# Patient Record
Sex: Male | Born: 1990 | Race: White | Hispanic: No | Marital: Married | Smoking: Never smoker
Health system: Southern US, Community
[De-identification: ages and names within clinical notes are randomized; demographics above are authoritative.]

## PROBLEM LIST (undated history)

## (undated) DIAGNOSIS — Q676 Pectus excavatum: Secondary | ICD-10-CM

## (undated) DIAGNOSIS — R0609 Other forms of dyspnea: Secondary | ICD-10-CM

## (undated) DIAGNOSIS — R9431 Abnormal electrocardiogram [ECG] [EKG]: Secondary | ICD-10-CM

## (undated) DIAGNOSIS — G4452 New daily persistent headache (NDPH): Secondary | ICD-10-CM

## (undated) DIAGNOSIS — Z6825 Body mass index (BMI) 25.0-25.9, adult: Secondary | ICD-10-CM

## (undated) HISTORY — DX: Abnormal electrocardiogram (ECG) (EKG): R94.31

## (undated) HISTORY — DX: Other forms of dyspnea: R06.09

## (undated) HISTORY — DX: New daily persistent headache (ndph): G44.52

## (undated) HISTORY — DX: Body mass index (BMI) 25.0-25.9, adult: Z68.25

## (undated) HISTORY — DX: Pectus excavatum: Q67.6

---
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
## 2008-12-13 ENCOUNTER — Emergency Department (HOSPITAL_COMMUNITY): Admission: EM | Admit: 2008-12-13 | Discharge: 2008-12-13 | Payer: Self-pay | Admitting: Emergency Medicine

## 2010-05-12 IMAGING — CR DG FINGER RING 2+V*R*
3 series · 3 of 3 positions shown · non-contrast
Comparison: None

CLINICAL DATA: Laceration

RIGHT RING FINGER 2+V

[x finger pa right]
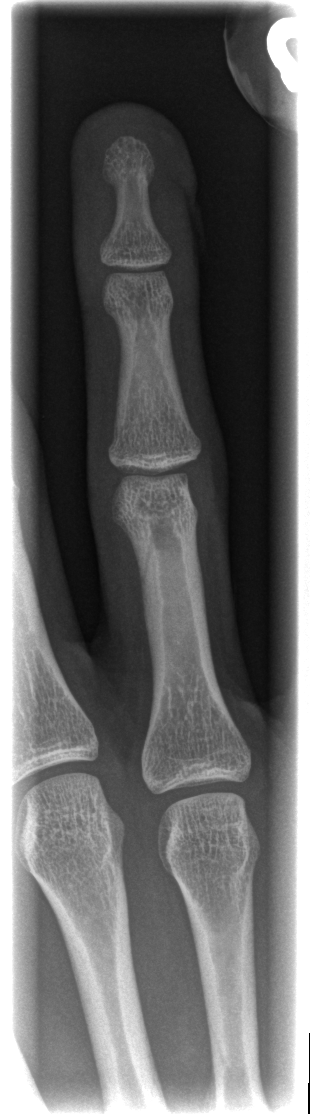

[x finger obl. right]
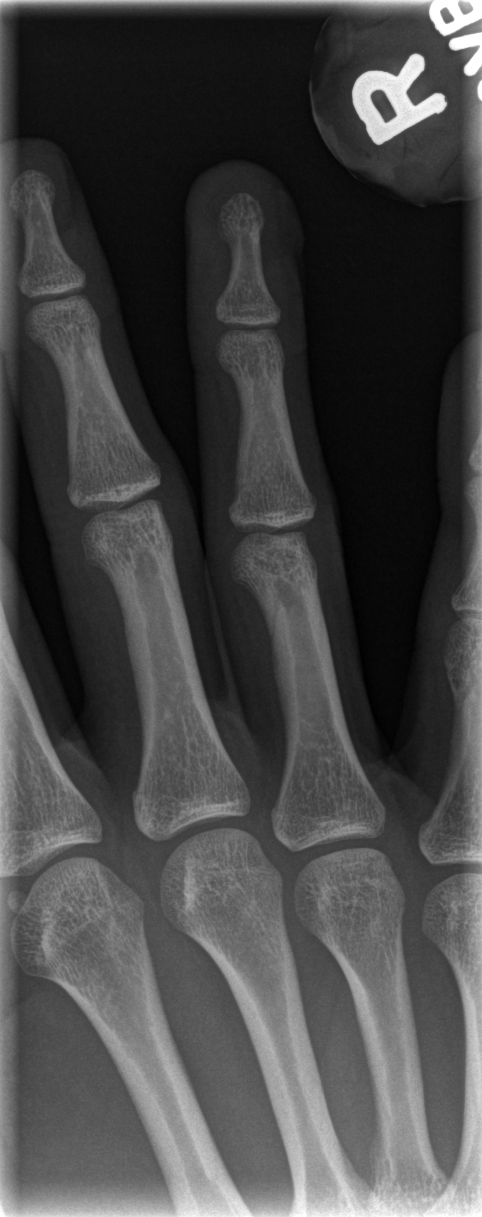

[x finger lateral right]
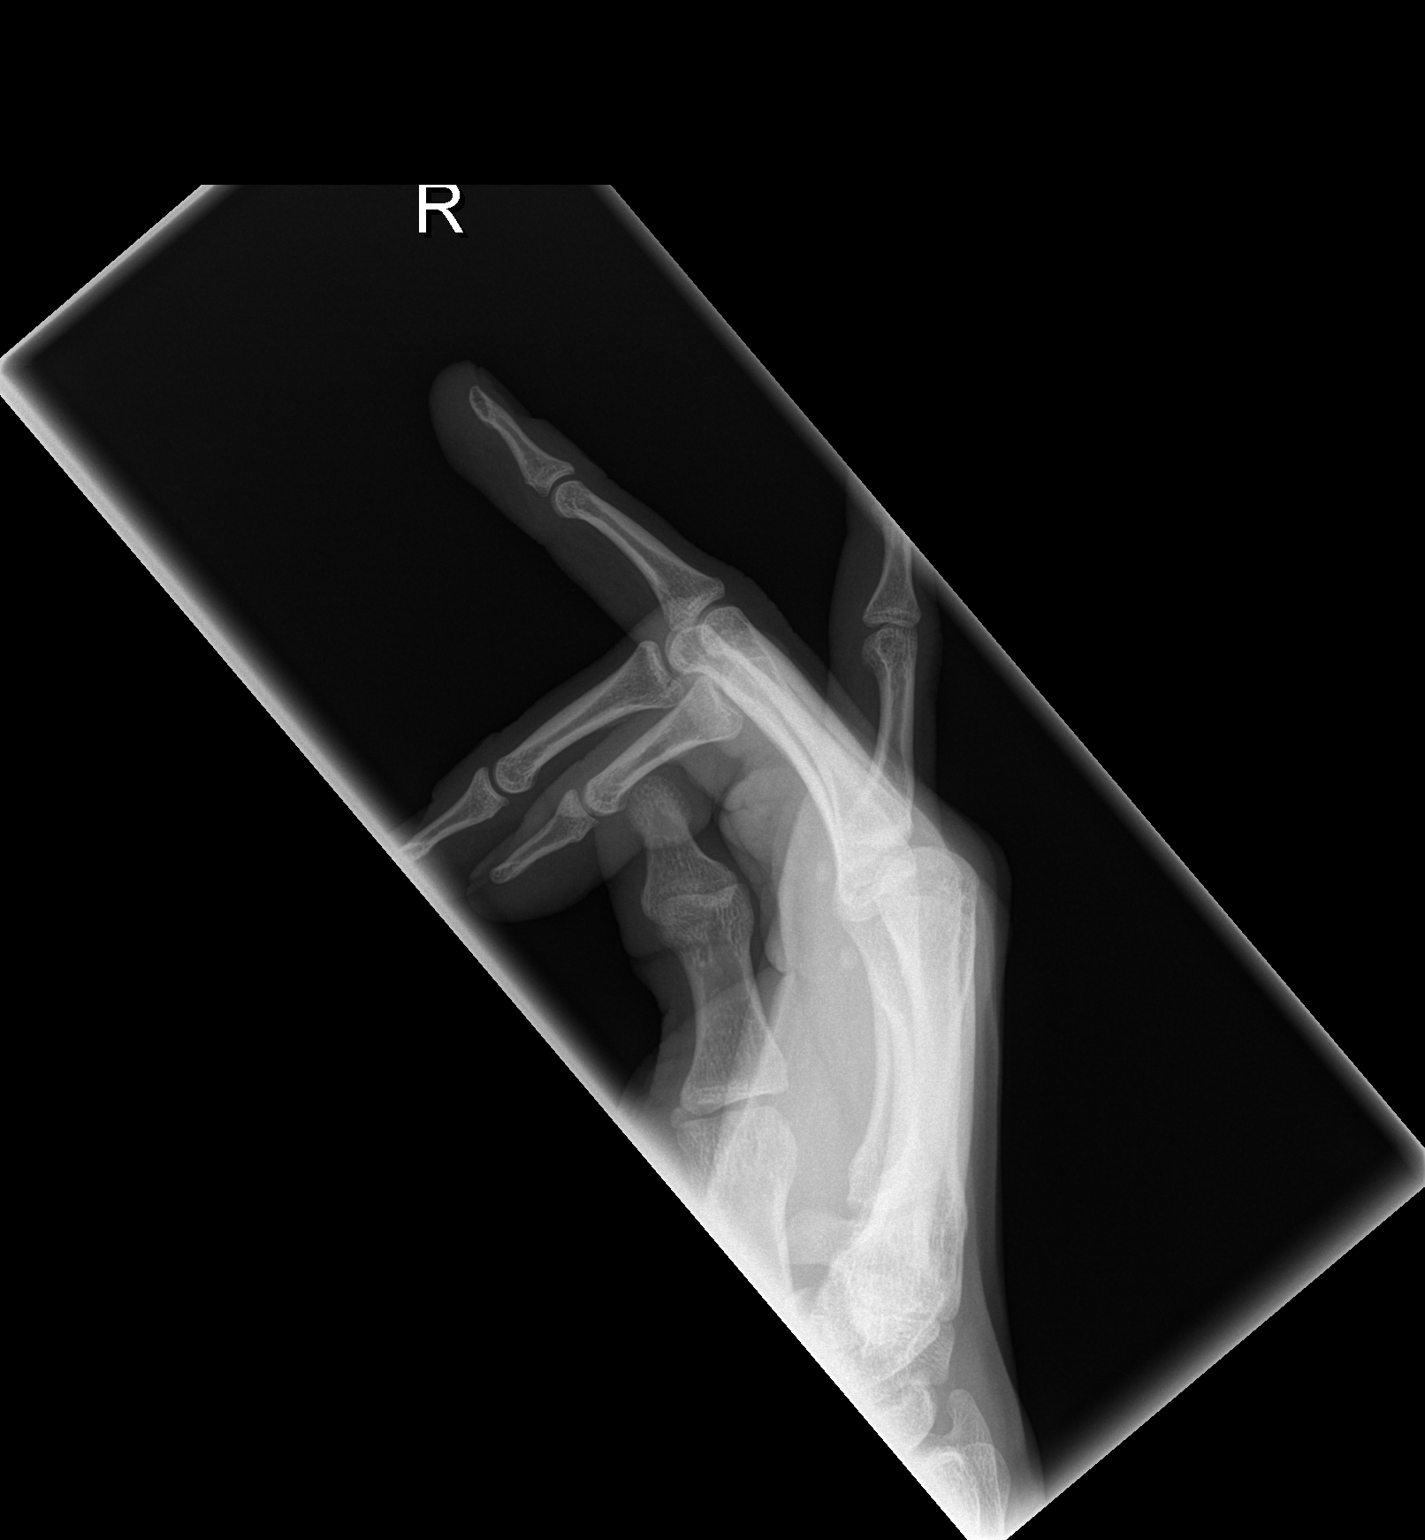

[3 of 3 positions shown; findings below may reference images not displayed]

FINDINGS: Soft tissue injury is noted.  No underlying fracture or
dislocation.
IMPRESSION: No acute bony pathology.

## 2012-05-13 ENCOUNTER — Other Ambulatory Visit (HOSPITAL_COMMUNITY): Payer: Self-pay | Admitting: Cardiology

## 2012-05-13 DIAGNOSIS — I309 Acute pericarditis, unspecified: Secondary | ICD-10-CM

## 2012-05-14 ENCOUNTER — Ambulatory Visit (HOSPITAL_COMMUNITY): Payer: Self-pay

## 2014-06-24 DIAGNOSIS — R599 Enlarged lymph nodes, unspecified: Secondary | ICD-10-CM | POA: Insufficient documentation

## 2014-06-24 HISTORY — DX: Enlarged lymph nodes, unspecified: R59.9

## 2014-06-26 DIAGNOSIS — B2790 Infectious mononucleosis, unspecified without complication: Secondary | ICD-10-CM | POA: Insufficient documentation

## 2014-06-26 DIAGNOSIS — J039 Acute tonsillitis, unspecified: Secondary | ICD-10-CM

## 2014-06-26 DIAGNOSIS — B009 Herpesviral infection, unspecified: Secondary | ICD-10-CM

## 2014-06-26 DIAGNOSIS — J029 Acute pharyngitis, unspecified: Secondary | ICD-10-CM

## 2014-06-26 HISTORY — DX: Herpesviral infection, unspecified: B00.9

## 2014-06-26 HISTORY — DX: Infectious mononucleosis, unspecified without complication: B27.90

## 2014-06-26 HISTORY — DX: Acute tonsillitis, unspecified: J03.90

## 2014-06-26 HISTORY — DX: Acute pharyngitis, unspecified: J02.9

## 2014-10-28 ENCOUNTER — Encounter: Payer: Self-pay | Admitting: *Deleted

## 2014-11-27 ENCOUNTER — Encounter: Payer: Self-pay | Admitting: Cardiology

## 2018-12-23 ENCOUNTER — Ambulatory Visit: Payer: Managed Care, Other (non HMO) | Admitting: Neurology

## 2019-01-27 ENCOUNTER — Ambulatory Visit: Payer: Managed Care, Other (non HMO) | Admitting: Neurology

## 2021-10-06 DIAGNOSIS — R0609 Other forms of dyspnea: Secondary | ICD-10-CM | POA: Insufficient documentation

## 2021-10-06 DIAGNOSIS — Q676 Pectus excavatum: Secondary | ICD-10-CM | POA: Insufficient documentation

## 2021-10-06 DIAGNOSIS — R9431 Abnormal electrocardiogram [ECG] [EKG]: Secondary | ICD-10-CM | POA: Insufficient documentation

## 2021-10-06 DIAGNOSIS — Z6825 Body mass index (BMI) 25.0-25.9, adult: Secondary | ICD-10-CM | POA: Insufficient documentation

## 2021-10-06 DIAGNOSIS — G4452 New daily persistent headache (NDPH): Secondary | ICD-10-CM | POA: Insufficient documentation

## 2021-10-07 ENCOUNTER — Encounter: Payer: Self-pay | Admitting: Cardiology

## 2021-10-07 ENCOUNTER — Ambulatory Visit (INDEPENDENT_AMBULATORY_CARE_PROVIDER_SITE_OTHER): Payer: No Typology Code available for payment source | Admitting: Cardiology

## 2021-10-07 VITALS — BP 113/66 | HR 87 | Ht 75.0 in | Wt 193.8 lb

## 2021-10-07 DIAGNOSIS — R9431 Abnormal electrocardiogram [ECG] [EKG]: Secondary | ICD-10-CM

## 2021-10-07 DIAGNOSIS — R0609 Other forms of dyspnea: Secondary | ICD-10-CM

## 2021-10-07 NOTE — Progress Notes (Signed)
Cardiology Office Note:    Date:  10/07/2021   ID:  Hunter Mcconnell, DOB 02/11/1991, MRN [id number]  PCP:  Lise Auer, MD  Cardiologist:  Garwin Brothers, MD   Referring MD: Lise Auer, MD    ASSESSMENT:    1. Dyspnea on minimal exertion   2. Abnormal ECG    PLAN:    In order of problems listed above:  Primary prevention stressed with the patient.  Importance of compliance with diet medication stressed and she vocalized understanding. Dyspnea on exertion: This is concerning.  I will send him to [facility] for blood work including D-dimer.  He tells me that he has not had blood work for the past several months.  This will help me plan for my further evaluation.  If the D-dimer is negative then I will assess him with the stress echocardiogram to see for any objective evidence of coronary or cardiac etiology for shortness of breath. Cardiac murmur and abnormal EKG: Echocardiogram will be done to assess murmur heard on auscultation and abnormal EKG. Patient will be seen in follow-up appointment in 2 months or earlier if the patient has any concerns.  He knows to go to the nearest emergency room for any concerning symptoms.    Medication Adjustments/Labs and Tests Ordered: Current medicines are reviewed at length with the patient today.  Concerns regarding medicines are outlined above.  No orders of the defined types were placed in this encounter.  No orders of the defined types were placed in this encounter.    No chief complaint on file.    History of Present Illness:    Hunter Mcconnell is a 31 y.o. male.  He has no significant past past medical history.  He mentions to me that he is noticing shortness of breath on exertion which is concerning to him.  He does not give any history of any medical issues such as asthma or or cigarette smoking.  He has not done any drugs in the past.  He overall is a healthy gentleman.  He shortness of breath on exertion is perplexing  and therefore he was sent here for evaluation.  His EKG is abnormal.  Past Medical History:  Diagnosis Date   Abnormal ECG    incomplete RBBB,borderline LA enlargement   Acute tonsillitis 06/26/2014   BMI 25.0-25.9,adult    Dyspnea on minimal exertion    Headache, new daily persistent ([facility])    Herpes simplex infection 06/26/2014   Formatting of this note might be different from the original. Detected via PCR during acute pharyngitis/tonsillitis   Mononucleosis 06/26/2014   Pectus excavatum    Pectus excavatum    Sore throat 06/26/2014   Swollen lymph nodes 06/24/2014    Past Surgical History:  Procedure Laterality Date   ELECTROPHYSIOLOGIC STUDY  2008    Current Medications: Current Meds  Medication Sig   amitriptyline (ELAVIL) 25 MG tablet Take 25 mg by mouth at bedtime.     Allergies:   Patient has no known allergies.   Social History   Socioeconomic History   Marital status: Married    Spouse name: Not on file   Number of children: Not on file   Years of education: Not on file   Highest education level: Not on file  Occupational History   Not on file  Tobacco Use   Smoking status: Never   Smokeless tobacco: Not on file  Substance and Sexual Activity   Alcohol use: No  Alcohol/week: 0.0 standard drinks of alcohol   Drug use: No   Sexual activity: Not on file  Other Topics Concern   Not on file  Social History Narrative   Not on file   Social Determinants of Health   Financial Resource Strain: Not on file  Food Insecurity: Not on file  Transportation Needs: Not on file  Physical Activity: Not on file  Stress: Not on file  Social Connections: Not on file     Family History: The patient's family history includes Diabetes in his maternal grandfather and paternal grandfather; Heart attack in his maternal grandfather and maternal grandmother; Heart disease in his father, maternal grandfather, maternal grandmother, paternal grandfather, and paternal  grandmother; Lung cancer in his maternal grandfather and paternal grandfather.  ROS:   Please see the history of present illness.    All other systems reviewed and are negative.  EKGs/Labs/Other Studies Reviewed:    The following studies were reviewed today: EKG reveals sinus rhythm biatrial enlargement right superior axis deviation right ventricular hypertrophy and poor anterior forces and nonspecific ST-T changes cannot rule out anterior wall myocardial infarction.   Recent Labs: No results found for requested labs within last 365 days.  Recent Lipid Panel No results found for: "CHOL", "TRIG", "HDL", "CHOLHDL", "VLDL", "LDLCALC", "LDLDIRECT"  Physical Exam:    VS:  BP 113/66   Pulse 87   Ht 6\' 3"  (1.905 m)   Wt 193 lb 12.8 oz (87.9 kg)   SpO2 97%   BMI 24.22 kg/m     Wt Readings from Last 3 Encounters:  10/07/21 193 lb 12.8 oz (87.9 kg)     GEN: Patient is in no acute distress HEENT: Normal NECK: No JVD; No carotid bruits LYMPHATICS: No lymphadenopathy CARDIAC: Hear sounds regular, 2/6 systolic murmur at the apex. RESPIRATORY:  Clear to auscultation without rales, wheezing or rhonchi  ABDOMEN: Soft, non-tender, non-distended MUSCULOSKELETAL:  No edema; No deformity  SKIN: Warm and dry NEUROLOGIC:  Alert and oriented x 3 PSYCHIATRIC:  Normal affect   Signed, 12/08/21, MD  10/07/2021 3:05 PM     [facility]

## 2021-10-07 NOTE — Patient Instructions (Signed)
Medication Instructions:  Your physician recommends that you continue on your current medications as directed. Please refer to the Current Medication list given to you today.  *If you need a refill on your cardiac medications before your next appointment, please call your pharmacy*   Lab Work: Go to [facility] now for stat labs. If you have labs (blood work) drawn today and your tests are completely normal, you will receive your results only by: MyChart Message (if you have MyChart) OR A paper copy in the mail If you have any lab test that is abnormal or we need to change your treatment, we will call you to review the results.   Testing/Procedures: Stress Echocardiogram Information Sheet Instructions:  This test will be done at [facility].  Nothing to eat or drink after midnight.  Dress prepared to exercise.  Please bring all current prescription medications.  Your physician has requested that you have an echocardiogram. Echocardiography is a painless test that uses sound waves to create images of your heart. It provides your doctor with information about the size and shape of your heart and how well your heart's chambers and valves are working. This procedure takes approximately one hour. There are no restrictions for this procedure.    Follow-Up: At [facility], you and your health needs are our priority.  As part of our continuing mission to provide you with exceptional heart care, we have created designated Provider Care Teams.  These Care Teams include your primary Cardiologist (physician) and Advanced Practice Providers (APPs -  Physician Assistants and Nurse Practitioners) who all work together to provide you with the care you need, when you need it.  We recommend signing up for the patient portal called "MyChart".  Sign up information is provided on this After Visit Summary.  MyChart is used to connect with patients for Virtual Visits (Telemedicine).  Patients  are able to view lab/test results, encounter notes, upcoming appointments, etc.  Non-urgent messages can be sent to your provider as well.   To learn more about what you can do with MyChart, go to [email or web address].    Your next appointment:   2 month(s)  The format for your next appointment:   In Person  Provider:   Belva Crome, MD   Other Instructions Exercise Stress Echocardiogram An exercise stress echocardiogram is a test to check how well your heart is working. This test uses sound waves (ultrasound) and a computer to make images of your heart before and after exercise. Ultrasound images that are taken before you exercise (resting echocardiogram) will show how much blood is getting to your heart muscle and how well yourheart muscle and heart valves are functioning. During the next part of this test, you will walk on a treadmill or ride a stationary bicycle to see how exercise affects your heart. While you exercise, the electrical activity of your heart will be monitored with anelectrocardiogram (ECG). Your blood pressure will also be monitored. You may have this test if you have: Chest pain or other symptoms of a heart problem. Recently had a heart attack or heart surgery. Heart valve problems. A condition that causes narrowing of the blood vessels that supply your heart (coronary artery disease). A high risk of heart disease and are starting a new exercise program. A high risk of heart disease and need to have major surgery. Heart arrhythmia (abnormal heart rhythm) problems. Heart failure problems. Tell a health care provider about: Any allergies you have. All medicines you are taking,  including vitamins, herbs, eye drops, creams, and over-the-counter medicines. Any problems you or family members have had with anesthetic medicines. Any surgeries you have had. Any blood disorders you have. Any medical conditions you have. Whether you are pregnant or may be  pregnant. What are the risks? Generally, this is a safe test. However, problems may occur, including: Chest pain. Dizziness or light-headedness. Shortness of breath. Increased or irregular heartbeat (palpitations). Nausea or vomiting. Heart attack. This is very rare. What happens before the test? Medicines Ask your health care provider about changing or stopping your regular medicines. This is especially important if you are taking diabetes medicines or blood thinners. If you use an inhaler, bring it with you to the test. General instructions Wear loose, comfortable clothing and walking shoes. Follow instructions from your health care provider about eating or drinking restrictions. You may be asked to avoid all forms of caffeine for 24 hours before your test, or as told by your health care provider. Do not use any products that contain nicotine or tobacco for 4 hours before the test, or as told by your health care provider. These products include cigarettes, chewing tobacco, and vaping devices, such as e-cigarettes. If you need help quitting, ask your health care provider. What happens during the test?  You will take off your clothes from the waist up and put on a hospital gown. Electrodes or electrocardiogram (ECG) patches may be placed on your chest. The electrodes or patches are then connected to a device that monitors your heart rate and rhythm. A blood pressure cuff will be placed on your arm. You will lie down on a table for an ultrasound exam before you exercise. A gel will be applied to your chest to help sound waves pass through your skin. A handheld device, called a transducer, will be pressed against your chest and moved over your heart. The transducer produces sound waves that travel to your heart and bounce back (or "echo" back) to the transducer. These sound waves will be captured in real-time and changed into images of your heart that can be viewed on a video monitor. The images  will be recorded on a computer and reviewed by your health care provider. Once the ultrasound is complete, you will start exercising by walking on a treadmill or pedaling a stationary bicycle. Your blood pressure and heart rhythm will be monitored while you exercise. The exercise will gradually get harder or faster. You will exercise until: Your heart reaches a target level. You are too tired to continue. You cannot continue because of chest pain, weakness, or dizziness. You will have another ultrasound exam immediately after you stop exercising. The procedure may vary among health care providers and hospitals. What can I expect after the test? After your test, it is common to have: Mild soreness. Mild fatigue. Your heart rate and blood pressure will be monitored until they return to yournormal levels. You should not have any new symptoms after this test. Follow these instructions at home: After your stress test, you should be able to return to your normal activities and diet. Take over-the-counter and prescription medicines only as told by your health care provider. Keep all follow-up visits. This is important. It is up to you to get the results of your test. Ask your health care provider, or the department that is doing the test, when your results will be ready. Contact a health care provider if you: Feel dizzy or light-headed. Have a fast or irregular heartbeat. Have  nausea or vomiting. Have a headache. Feel short of breath. Get help right away if you: Develop pain or pressure: In your chest. In your jaw or neck. Between your shoulder blades. Radiating down your left arm. Faint. Have trouble breathing. These symptoms may represent a serious problem that is an emergency. Do not wait to see if the symptoms will go away. Get medical help right away. Call your local emergency services (911 in the [place] Do not drive yourself to the hospital. Summary An exercise stress  echocardiogram is a test that uses ultrasound to check how well your heart works before and after exercise. Before the test, follow instructions from your health care provider about stopping medicines and avoiding caffeine, nicotine and tobacco, and certain foods and drinks. During the test, your blood pressure and heart rhythm will be monitored while you exercise on a treadmill or stationary bicycle. This information is not intended to replace advice given to you by your health care provider. Make sure you discuss any questions you have with your healthcare provider. Document Revised: 11/11/2019 Document Reviewed: 11/11/2019 Elsevier Patient Education  2022 [employer].

## 2021-10-12 ENCOUNTER — Encounter: Payer: Self-pay | Admitting: Cardiology

## 2021-10-12 DIAGNOSIS — R0609 Other forms of dyspnea: Secondary | ICD-10-CM

## 2021-12-13 ENCOUNTER — Ambulatory Visit: Payer: No Typology Code available for payment source | Admitting: Cardiology

## 2021-12-15 ENCOUNTER — Encounter: Payer: Self-pay | Admitting: Cardiology

## 2021-12-15 ENCOUNTER — Ambulatory Visit: Payer: No Typology Code available for payment source | Attending: Cardiology | Admitting: Cardiology

## 2021-12-15 VITALS — BP 130/94 | HR 56 | Ht 75.0 in | Wt 197.6 lb

## 2021-12-15 DIAGNOSIS — R9431 Abnormal electrocardiogram [ECG] [EKG]: Secondary | ICD-10-CM

## 2021-12-15 DIAGNOSIS — R030 Elevated blood-pressure reading, without diagnosis of hypertension: Secondary | ICD-10-CM | POA: Insufficient documentation

## 2021-12-15 DIAGNOSIS — E782 Mixed hyperlipidemia: Secondary | ICD-10-CM | POA: Diagnosis not present

## 2021-12-15 NOTE — Progress Notes (Signed)
Cardiology Office Note:    Date:  12/15/2021   ID:  Hunter Mcconnell, DOB 1991-01-21, MRN [id number]  PCP:  Lise Auer, MD  Cardiologist:  Garwin Brothers, MD   Referring MD: Lise Auer, MD    ASSESSMENT:    1. Abnormal ECG   2. Elevated blood pressure reading in office without diagnosis of hypertension   3. Mixed dyslipidemia    PLAN:    In order of problems listed above:  Primary prevention stressed with the patient.  Importance of compliance with diet medication stressed and he vocalized understanding.  He was advised to walk at least half an hour a day 5 days a week and he promises to do so. Elevated blood pressure without diagnosis of hypertension: Patient mentions to me that his blood pressure is better at home.  He has an element of whitecoat hypertension.  He will keep a track of it and get back to it with his primary care doctor for follow-up. Mixed dyslipidemia: Mildly elevated lipids.  This is followed by primary care.  Diet emphasized.  He promises to do better. He will be seen in follow-up appointment on a as needed basis.  Patient had multiple questions which were answered to his satisfaction.   Medication Adjustments/Labs and Tests Ordered: Current medicines are reviewed at length with the patient today.  Concerns regarding medicines are outlined above.  No orders of the defined types were placed in this encounter.  No orders of the defined types were placed in this encounter.    No chief complaint on file.    History of Present Illness:    Hunter Mcconnell is a 31 y.o. male.  Patient has past medical history of abnormal EKG.  He underwent echocardiogram and stress test which were unremarkable.  He denies any chest pain orthopnea or PND.  He denies any history of hypertension.  At the time of my evaluation, the patient is alert awake oriented and in no distress.  Past Medical History:  Diagnosis Date   Abnormal ECG    incomplete RBBB,borderline LA  enlargement   Acute tonsillitis 06/26/2014   BMI 25.0-25.9,adult    Dyspnea on minimal exertion    Headache, new daily persistent ([facility])    Herpes simplex infection 06/26/2014   Formatting of this note might be different from the original. Detected via PCR during acute pharyngitis/tonsillitis   Mononucleosis 06/26/2014   Pectus excavatum    Pectus excavatum    Sore throat 06/26/2014   Swollen lymph nodes 06/24/2014    Past Surgical History:  Procedure Laterality Date   ELECTROPHYSIOLOGIC STUDY  2008    Current Medications: No outpatient medications have been marked as taking for the 12/15/21 encounter (Office Visit) with Tad Fancher, Aundra Dubin, MD.     Allergies:   Patient has no known allergies.   Social History   Socioeconomic History   Marital status: Married    Spouse name: Not on file   Number of children: Not on file   Years of education: Not on file   Highest education level: Not on file  Occupational History   Not on file  Tobacco Use   Smoking status: Never   Smokeless tobacco: Not on file  Substance and Sexual Activity   Alcohol use: No    Alcohol/week: 0.0 standard drinks of alcohol   Drug use: No   Sexual activity: Not on file  Other Topics Concern   Not on file  Social History Narrative  Not on file   Social Determinants of Health   Financial Resource Strain: Not on file  Food Insecurity: Not on file  Transportation Needs: Not on file  Physical Activity: Not on file  Stress: Not on file  Social Connections: Not on file     Family History: The patient's family history includes Diabetes in his maternal grandfather and paternal grandfather; Heart attack in his maternal grandfather and maternal grandmother; Heart disease in his father, maternal grandfather, maternal grandmother, paternal grandfather, and paternal grandmother; Lung cancer in his maternal grandfather and paternal grandfather.  ROS:   Please see the history of present illness.    All other  systems reviewed and are negative.  EKGs/Labs/Other Studies Reviewed:    The following studies were reviewed today: I discussed my findings with the patient at length.  Echo and stress echo were unremarkable.   Recent Labs: No results found for requested labs within last 365 days.  Recent Lipid Panel No results found for: "CHOL", "TRIG", "HDL", "CHOLHDL", "VLDL", "LDLCALC", "LDLDIRECT"  Physical Exam:    VS:  BP (!) 130/94   Pulse (!) 56   Ht 6\' 3"  (1.905 m)   Wt 197 lb 9.6 oz (89.6 kg)   SpO2 97%   BMI 24.70 kg/m     Wt Readings from Last 3 Encounters:  12/15/21 197 lb 9.6 oz (89.6 kg)  10/07/21 193 lb 12.8 oz (87.9 kg)     GEN: Patient is in no acute distress HEENT: Normal NECK: No JVD; No carotid bruits LYMPHATICS: No lymphadenopathy CARDIAC: Hear sounds regular, 2/6 systolic murmur at the apex. RESPIRATORY:  Clear to auscultation without rales, wheezing or rhonchi  ABDOMEN: Soft, non-tender, non-distended MUSCULOSKELETAL:  No edema; No deformity  SKIN: Warm and dry NEUROLOGIC:  Alert and oriented x 3 PSYCHIATRIC:  Normal affect   Signed, 12/08/21, MD  12/15/2021 3:52 PM    [facility]

## 2021-12-15 NOTE — Patient Instructions (Signed)

## 2023-08-17 ENCOUNTER — Emergency Department (HOSPITAL_BASED_OUTPATIENT_CLINIC_OR_DEPARTMENT_OTHER): Admission: EM | Admit: 2023-08-17 | Discharge: 2023-08-17 | Disposition: A | Discharge status: Home / Self Care

## 2023-08-17 ENCOUNTER — Encounter (HOSPITAL_BASED_OUTPATIENT_CLINIC_OR_DEPARTMENT_OTHER): Payer: Self-pay | Admitting: Emergency Medicine

## 2023-08-17 ENCOUNTER — Emergency Department (HOSPITAL_BASED_OUTPATIENT_CLINIC_OR_DEPARTMENT_OTHER): Admitting: Radiology

## 2023-08-17 ENCOUNTER — Other Ambulatory Visit: Payer: Self-pay

## 2023-08-17 ENCOUNTER — Emergency Department (HOSPITAL_BASED_OUTPATIENT_CLINIC_OR_DEPARTMENT_OTHER)

## 2023-08-17 DIAGNOSIS — R944 Abnormal results of kidney function studies: Secondary | ICD-10-CM | POA: Insufficient documentation

## 2023-08-17 DIAGNOSIS — R519 Headache, unspecified: Secondary | ICD-10-CM | POA: Diagnosis present

## 2023-08-17 DIAGNOSIS — R030 Elevated blood-pressure reading, without diagnosis of hypertension: Secondary | ICD-10-CM | POA: Diagnosis not present

## 2023-08-17 DIAGNOSIS — I10 Essential (primary) hypertension: Secondary | ICD-10-CM

## 2023-08-17 LAB — CBC
HCT: 42.9 % (ref 39.0–52.0)
Hemoglobin: 14.6 g/dL (ref 13.0–17.0)
MCH: 30.6 pg (ref 26.0–34.0)
MCHC: 34 g/dL (ref 30.0–36.0)
MCV: 89.9 fL (ref 80.0–100.0)
Platelets: 215 10*3/uL (ref 150–400)
RBC: 4.77 MIL/uL (ref 4.22–5.81)
RDW: 11.8 % (ref 11.5–15.5)
WBC: 6.5 10*3/uL (ref 4.0–10.5)
nRBC: 0 % (ref 0.0–0.2)

## 2023-08-17 LAB — BASIC METABOLIC PANEL WITH GFR
Anion gap: 11 (ref 5–15)
BUN: 19 mg/dL (ref 6–20)
CO2: 26 mmol/L (ref 22–32)
Calcium: 9.4 mg/dL (ref 8.9–10.3)
Chloride: 105 mmol/L (ref 98–111)
Creatinine, Ser: 1.28 mg/dL — ABNORMAL HIGH (ref 0.61–1.24)
GFR, Estimated: >60 mL/min (ref >60)
Glucose, Bld: 91 mg/dL (ref 70–99)
Potassium: 4 mmol/L (ref 3.5–5.1)
Sodium: 142 mmol/L (ref 135–145)

## 2023-08-17 LAB — TROPONIN T, HIGH SENSITIVITY: Troponin T High Sensitivity: <15 ng/L (ref <19)

## 2023-08-17 MED ORDER — SODIUM CHLORIDE 0.9 % IV BOLUS
1000.0000 mL | Freq: Once | INTRAVENOUS | Status: AC
  Administered 2023-08-17: 1000 mL via INTRAVENOUS

## 2023-08-17 MED ORDER — AMLODIPINE BESYLATE 5 MG PO TABS: 5.0000 mg | ORAL_TABLET | Freq: Every day | ORAL | 1 refills | Status: AC

## 2023-08-17 MED ORDER — DIPHENHYDRAMINE HCL 50 MG/ML IJ SOLN
25.0000 mg | Freq: Once | INTRAMUSCULAR | Status: AC
  Administered 2023-08-17: 25 mg via INTRAVENOUS
  Filled 2023-08-17: qty 1

## 2023-08-17 MED ORDER — METOCLOPRAMIDE HCL 5 MG/ML IJ SOLN
10.0000 mg | Freq: Once | INTRAMUSCULAR | Status: AC
  Administered 2023-08-17: 10 mg via INTRAVENOUS
  Filled 2023-08-17: qty 2

## 2023-08-17 MED ORDER — ACETAMINOPHEN 500 MG PO TABS
1000.0000 mg | ORAL_TABLET | Freq: Once | ORAL | Status: AC
  Administered 2023-08-17: 1000 mg via ORAL
  Filled 2023-08-17: qty 2

## 2023-08-17 MED ORDER — SUMATRIPTAN SUCCINATE 100 MG PO TABS: 100.0000 mg | ORAL_TABLET | ORAL | 0 refills | Status: DC | PRN

## 2023-08-17 NOTE — ED Triage Notes (Signed)
 Pt endorses "bad HAs" x 3-3 days recently, checked bp and was 160/100. Also reports LT side CP for same time.

## 2023-08-17 NOTE — ED Provider Notes (Signed)
 [facility] AT [facility] Provider Note   CSN: [id number] Arrival date & time: 08/17/23  1705     History  Chief Complaint  Patient presents with   Hypertension    Hunter Mcconnell is a 33 y.o. male.   Hypertension   33 year old male presents emergency department with complaints of elevated blood pressure reading, headache, chest pain.  Patient states that he has been dealing with intermittent headaches for the past several years.  Reports current headache feels somewhat similar.  States that his headaches have been difficult to control and does have outstanding referral to neurology.  Tried Excedrin yesterday without improvement of headache.  States he was having a little worsening of headache today at work.  A coworker took his blood pressure was elevated in the 150s and subsequently in the 160s systolic.  States that since earlier this morning, has been having left-sided chest pain.  Stated this is also been intermittent since onset.  Denies any exertional worsening of symptoms or her symptoms worsen with taking a deep breath.  Denies any accompanied feelings of shortness of breath, abdominal pain, vomiting.  Denies any history of known elevated blood pressure.  Past medical history significant for headache, mononucleosis, HSV, dyslipidemia  Home Medications Prior to Admission medications   Not on File      Allergies    Patient has no known allergies.    Review of Systems   Review of Systems  All other systems reviewed and are negative.   Physical Exam Updated Vital Signs BP (!) 154/103 (BP Location: Right Arm)   Pulse 66   Temp 98.6 F (37 C) (Oral)   Resp 18   Wt 93.9 kg   SpO2 98%   BMI 25.87 kg/m  Physical Exam Vitals and nursing note reviewed.  Constitutional:      General: He is not in acute distress.    Appearance: He is well-developed.  HENT:     Head: Normocephalic and atraumatic.  Eyes:     Conjunctiva/sclera:  Conjunctivae normal.  Cardiovascular:     Rate and Rhythm: Normal rate and regular rhythm.     Heart sounds: No murmur heard. Pulmonary:     Effort: Pulmonary effort is normal. No respiratory distress.     Breath sounds: Normal breath sounds. No wheezing, rhonchi or rales.  Abdominal:     Palpations: Abdomen is soft.     Tenderness: There is no abdominal tenderness.  Musculoskeletal:        General: No swelling.     Cervical back: Neck supple.  Skin:    General: Skin is warm and dry.     Capillary Refill: Capillary refill takes less than 2 seconds.  Neurological:     Mental Status: He is alert.     Comments: Alert and oriented to self, place, time and event.   Speech is fluent, clear without dysarthria or dysphasia.   Strength 5/5 in upper/lower extremities   Sensation intact in upper/lower extremities   Normal gait.  CN I not tested  CN II not tested CN III, IV, VI PERRLA and EOMs intact bilaterally  CN V Intact sensation to sharp and light touch to the face  CN VII facial movements symmetric  CN VIII not tested  CN IX, X no uvula deviation, symmetric rise of soft palate  CN XI 5/5 SCM and trapezius strength bilaterally  CN XII Midline tongue protrusion, symmetric L/R movements     Psychiatric:  Mood and Affect: Mood normal.     ED Results / Procedures / Treatments   Labs (all labs ordered are listed, but only abnormal results are displayed) Labs Reviewed  CBC  BASIC METABOLIC PANEL WITH GFR  TROPONIN T, HIGH SENSITIVITY    EKG None  Radiology No results found.  Procedures Procedures    Medications Ordered in ED Medications - No data to display  ED Course/ Medical Decision Making/ A&P                                 Medical Decision Making Amount and/or Complexity of Data Reviewed Labs: ordered. Radiology: ordered.   This patient presents to the ED for concern of headache, chest pain, this involves an extensive number of treatment  options, and is a complaint that carries with it a high risk of complications and morbidity.  The differential diagnosis includes CVA, migraine/tension/cluster headache, cerebral venous thrombosis, carotid artery/vertebral artery dissection, ACS, PE, GERD, musculoskeletal, pneumothorax, pneumonia, other   Co morbidities that complicate the patient evaluation  See HPI   Additional history obtained:  Additional history obtained from EMR External records from outside source obtained and reviewed including hospital records   Lab Tests:  I Ordered, and personally interpreted labs.  The pertinent results include: No leukocytosis.  No evidence of anemia.  Platelets within range. Extremities.  Elevation in creatinine 1.28.  No prior to compare.  Troponin of less than 15   Imaging Studies ordered:  I ordered imaging studies including chest x-ray, CT head I independently visualized and interpreted imaging which showed  Chest x-ray: No acute cardiopulmonary abnormality CT head: No acute intracranial abnormality. I agree with the radiologist interpretation   Cardiac Monitoring: / EKG:  The patient was maintained on a cardiac monitor.  I personally viewed and interpreted the cardiac monitored which showed an underlying rhythm of: Sinus rhythm.  Possible left atrial enlargement.   Consultations Obtained:  N/a   Problem List / ED Course / Critical interventions / Medication management  Elevated blood pressure reading, headache, chest pain I ordered medication including Tylenol, Benadryl, Reglan, normal saline   Reevaluation of the patient after these medicines showed that the patient improved I have reviewed the patients home medicines and have made adjustments as needed   Social Determinants of Health:  Denies tobacco, licit drug use.   Test / Admission - Considered:  Elevated blood pressure reading, headache, chest pain Vitals signs significant for hypertension blood  pressure 154/100.Aaron Aas Otherwise within normal range and stable throughout visit. Laboratory/imaging studies significant for: See above 33 year old male presents emergency department with complaints of elevated blood pressure reading, headache, chest pain.  Patient states that he has been dealing with intermittent headaches for the past several years.  Reports current headache feels somewhat similar.  States that his headaches have been difficult to control and does have outstanding referral to neurology.  Tried Excedrin yesterday without improvement of headache.  States he was having a little worsening of headache today at work.  A coworker took his blood pressure was elevated in the 150s and subsequently in the 160s systolic.  States that since earlier this morning, has been having left-sided chest pain.  Stated this is also been intermittent since onset.  Denies any exertional worsening of symptoms or her symptoms worsen with taking a deep breath.  Denies any accompanied feelings of shortness of breath, abdominal pain, vomiting.  Denies any history  of known elevated blood pressure. On exam, no chest wall tenderness.  No abdominal tenderness.  Lungs clear to auscultation bilaterally.  Workup today reassuring.  Patient with negative troponin, lack of acute ischemic change on EKG; low suspicion for ACS.  Patient Wells PE 0 and PERC negative; low suspicion for PE.  Patient without chest pain rating to back, pulse deficits, neurodeficits, significant hypertension; low suspicion for aortic dissection.  Regarding headache, nonfocal neurologic exam.  Patient with history of headaches for the past several years with increased frequency.  Given increased frequency of headaches with no prior neuroimaging performed, CT imaging was obtained which was negative for any acute intracranial process.  Treated with migraine cocktail and did note significant improvement of symptoms.  Patient was with elevated blood pressure 154  systolic.  Will begin blood pressure medication recommend continue logging blood pressure at home.  Regarding headache, will refer to neurology as he was supposed to have been referred by primary care but never had appointment made.  Will recommend follow-up with PCP for reevaluation/continued monitoring of blood pressure.  Treatment plan discussed with patient and he acknowledged understanding was agreeable to said plan.  Patient well-appearing, afebrile in no acute distress. Worrisome signs and symptoms were discussed with the patient, and the patient acknowledged understanding to return to the ED if noticed. Patient was stable upon discharge.          Final Clinical Impression(s) / ED Diagnoses Final diagnoses:  None    Rx / DC Orders ED Discharge Orders     None         Howe Butter, [place] 08/17/23 1900    Amariyon, [place], DO 08/17/23 2111

## 2023-08-17 NOTE — Discharge Instructions (Addendum)
 As discussed, your workup today was overall reassuring.  CT imaging of your head appeared normal.  Your cardiac workup today also was reassuring.  Your heart enzyme was normal and your EKG appeared normal; does not appear that you have a heart attack.  Will begin a low-dose of blood pressure medication.  Continue to monitor your blood pressure at home once in the morning once in the evening.  Recommend getting an arm cuff to check your blood pressure.  Recommend following up with your primary care for reassessment of your symptoms.  Will also send a referral for neurology to follow-up with for reassessment regarding your headaches.  Will send in medication to use as needed for breakthrough headaches.  Please do not hesitate to return to emergency department if the worrisome signs and symptoms we discussed become apparent.

## 2023-08-20 ENCOUNTER — Encounter: Payer: Self-pay | Admitting: Neurology

## 2023-10-16 ENCOUNTER — Ambulatory Visit (INDEPENDENT_AMBULATORY_CARE_PROVIDER_SITE_OTHER): Admitting: Neurology

## 2023-10-16 ENCOUNTER — Encounter: Payer: Self-pay | Admitting: Neurology

## 2023-10-16 VITALS — BP 124/87 | HR 59 | Ht 75.0 in | Wt 211.0 lb

## 2023-10-16 DIAGNOSIS — R519 Headache, unspecified: Secondary | ICD-10-CM

## 2023-10-16 MED ORDER — NORTRIPTYLINE HCL 10 MG PO CAPS: ORAL_CAPSULE | ORAL | 3 refills | Status: AC

## 2023-10-16 MED ORDER — RIZATRIPTAN BENZOATE 10 MG PO TABS: 10.0000 mg | ORAL_TABLET | ORAL | 3 refills | Status: DC | PRN

## 2023-10-16 NOTE — Patient Instructions (Signed)
 Start nortriptyline  10mg  at bedtime for 2 week, then increase to 2 tablet at bedtime  For severe headache, take maxalt  10mg .    Limit ibuprofen, tylenol , and maxalt  to twice per week.

## 2023-10-16 NOTE — Progress Notes (Signed)
 [facility] Neurology Division Clinic Note - Initial Visit   Date: 10/16/2023   [name] MRN: [id number] DOB: 1990-06-23   Dear [name], PA:  Thank you for your kind referral of Hunter Mcconnell for consultation of headaches. Although his history is well known to you, please allow us  to reiterate it for the purpose of our medical record. The patient was accompanied to the clinic by self.    Hunter Mcconnell is a 33 y.o. right-handed male with hypertension presenting for evaluation of headaches.   IMPRESSION/PLAN: Chronic daily headache.  Neurological exam is normal.   - Previously tried: imitrex  100mg  (no benefit)   - Start nortriptyline  10mg  at bedtime for 2 week, then increase to 2 tablet at bedtime  - For severe migraine, start maxalt  10mg  as needed   - Limit NSAID, tylenol , and maxalt  to twice per week  Return to clinic in 3-4 months  ------------------------------------------------------------- History of present illness: For the past 2-3 years, he has daily headaches over the left frontal region, described as achy.  It can can last all day.  He has associated nausea, photophobia, and phonophobia.  He has no relief with ibuprofen.  Headaches are worse in the morning.  Headaches ar enot worse with coughing, sneezing, or bearing down. No vision changes, numbness, tingling.    He went to the ER on 5/16 and was given imitrex , without any improvement.  Nonsmoker.  Rare alcohol use.  Works as Hotel manager.  Out-side paper records, electronic medical record, and images have been reviewed where available and summarized as:  CT head 08/17/2023:  No acute intracranial abnormalities.   Past Medical History:  Diagnosis Date   Abnormal ECG    incomplete RBBB,borderline LA enlargement   Acute tonsillitis 06/26/2014   BMI 25.0-25.9,adult    Dyspnea on minimal exertion    Headache, new daily persistent ([facility])    Herpes simplex infection 06/26/2014    Formatting of this note might be different from the original. Detected via PCR during acute pharyngitis/tonsillitis   Mononucleosis 06/26/2014   Pectus excavatum    Pectus excavatum    Sore throat 06/26/2014   Swollen lymph nodes 06/24/2014    Past Surgical History:  Procedure Laterality Date   ELECTROPHYSIOLOGIC STUDY  2008     Medications:  Outpatient Encounter Medications as of 10/16/2023  Medication Sig   amLODipine  (NORVASC ) 5 MG tablet Take 1 tablet (5 mg total) by mouth daily.   SUMAtriptan  (IMITREX ) 100 MG tablet Take 1 tablet (100 mg total) by mouth every 2 (two) hours as needed for migraine. May repeat in 2 hours if headache persists or recurs. Do not exceed two doses in 24 hours (Patient not taking: Reported on 10/16/2023)   No facility-administered encounter medications on file as of 10/16/2023.    Allergies: No Known Allergies  Family History: Family History  Problem Relation Age of Onset   Heart disease Father    Heart disease Maternal Grandmother    Heart attack Maternal Grandmother    Heart disease Maternal Grandfather    Lung cancer Maternal Grandfather    Diabetes Maternal Grandfather    Heart attack Maternal Grandfather    Heart disease Paternal Grandmother    Heart disease Paternal Grandfather    Diabetes Paternal Grandfather    Lung cancer Paternal Grandfather     Social History: Social History   Tobacco Use   Smoking status: Never  Vaping Use   Vaping status: Never Used  Substance Use  Topics   Alcohol use: Yes    Comment: rare   Drug use: No   Social History   Social History Narrative   Are you right handed or left handed? Right   Are you currently employed ?    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   Who lives with you? son   What type of home do you live in: 1 story or 2 story? one    Caffiene 1 tea a day    Vital Signs:  BP 124/87   Pulse (!) 59   Ht 6' 3 (1.905 m)   Wt 211 lb (95.7 kg)    SpO2 96%   BMI 26.37 kg/m   Neurological Exam: MENTAL STATUS including orientation to time, place, person, recent and remote memory, attention span and concentration, language, and fund of knowledge is normal.  Speech is not dysarthric.  CRANIAL NERVES: II:  No visual field defects.     III-IV-VI: Pupils equal round and reactive to light.  Normal conjugate, extra-ocular eye movements in all directions of gaze.  No nystagmus.  No ptosis.   V:  Normal facial sensation.    VII:  Normal facial symmetry and movements.   VIII:  Normal hearing and vestibular function.   IX-X:  Normal palatal movement.   XI:  Normal shoulder shrug and head rotation.   XII:  Normal tongue strength and range of motion, no deviation or fasciculation.  MOTOR:  Motor strength is 5/5 throughout. No atrophy, fasciculations or abnormal movements.  No pronator drift.   MSRs:                                           Right        Left brachioradialis 2+  2+  biceps 2+  2+  triceps 2+  2+  patellar 2+  2+  ankle jerk 2+  2+  Hoffman no  no  plantar response down  down   SENSORY:  Normal and symmetric perception of light touch, vibration, and temperature.  COORDINATION/GAIT: Normal finger-to- nose-finger.  Intact rapid alternating movements bilaterally.  Able to rise from a chair without using arms.  Gait narrow based and stable. Tandem and stressed gait intact.    Thank you for allowing me to participate in patient's care.  If I can answer any additional questions, I would be pleased to do so.    Sincerely,    Tracker Mance K. Tobie, DO

## 2024-02-12 ENCOUNTER — Ambulatory Visit (INDEPENDENT_AMBULATORY_CARE_PROVIDER_SITE_OTHER): Admitting: Neurology

## 2024-02-12 ENCOUNTER — Encounter: Payer: Self-pay | Admitting: Neurology

## 2024-02-12 VITALS — BP 134/86 | HR 79 | Resp 20 | Ht 75.0 in | Wt 208.0 lb

## 2024-02-12 DIAGNOSIS — G43909 Migraine, unspecified, not intractable, without status migrainosus: Secondary | ICD-10-CM | POA: Diagnosis not present

## 2024-02-12 MED ORDER — RIZATRIPTAN BENZOATE 10 MG PO TABS: 10.0000 mg | ORAL_TABLET | ORAL | 3 refills | Status: AC | PRN

## 2024-02-12 NOTE — Progress Notes (Signed)
 Follow-up Visit   Date: 02/12/2024    Hunter Mcconnell MRN: [id number] DOB: [birth year]    Hunter Mcconnell is a 33 y.o. right-handed male returning to the clinic for follow-up of chronic daily headaches.  The patient was accompanied to the clinic by self.  IMPRESSION/PLAN: Chronic daily headaches, resolved  - No longer on nortriptyline   2.   Episodic migraine without aura  - Previously tried: imitrex  100mg  (no benefit)               - OK to take maxalt  10mg  for severe migraine   Return to clinic 1 year  --------------------------------------------- History of present illness: For the past 2-3 years, he has daily headaches over the left frontal region, described as achy.  It can can last all day.  He has associated nausea, photophobia, and phonophobia.  He has no relief with ibuprofen.  Headaches are worse in the morning.  Headaches ar enot worse with coughing, sneezing, or bearing down. No vision changes, numbness, tingling.     He went to the ER on 5/16 and was given imitrex , without any improvement.  Nonsmoker.  Rare alcohol use.  Works as hotel manager.   UPDATE 02/12/2024:  He is here for follow-up visit.   He took nortriptyline  for about a month and then did not get a refill.  He reports headaches have significantly improved.  He gets 3-4 headaches per month and takes ibuprofen 800mg  which ease the pain.  He does not recall taking maxalt .   Medications:  Current Outpatient Medications on File Prior to Visit  Medication Sig Dispense Refill   amLODipine  (NORVASC ) 5 MG tablet Take 1 tablet (5 mg total) by mouth daily. (Patient not taking: Reported on 02/12/2024) 30 tablet 1   nortriptyline  (PAMELOR ) 10 MG capsule Start 10mg  at bedtime for 2 week, then increase to 2 tablet at bedtime (Patient not taking: Reported on 02/12/2024) 60 capsule 3   rizatriptan  (MAXALT ) 10 MG tablet Take 1 tablet (10 mg total) by mouth as needed for migraine. May repeat in 2 hours if needed  (Patient not taking: Reported on 02/12/2024) 10 tablet 3   SUMAtriptan  (IMITREX ) 100 MG tablet Take 1 tablet (100 mg total) by mouth every 2 (two) hours as needed for migraine. May repeat in 2 hours if headache persists or recurs. Do not exceed two doses in 24 hours (Patient not taking: Reported on 10/16/2023) 10 tablet 0   No current facility-administered medications on file prior to visit.    Allergies: No Known Allergies  Vital Signs:  BP 134/86   Pulse 79   Resp 20   Ht 6' 3 (1.905 m)   Wt 208 lb (94.3 kg)   SpO2 96%   BMI 26.00 kg/m   Neurological Exam: MENTAL STATUS including orientation to time, place, person, recent and remote memory, attention span and concentration, language, and fund of knowledge is normal.  Speech is not dysarthric.  CRANIAL NERVES:  No visual field defects.  Pupils equal round and reactive to light.  Normal conjugate, extra-ocular eye movements in all directions of gaze.  No ptosis.  Face is symmetric. Palate elevates symmetrically.  Tongue is midline.  MOTOR:  Motor strength is 5/5 in all extremities.  No atrophy, fasciculations or abnormal movements.  No pronator drift.  Tone is normal.    COORDINATION/GAIT:  Gait narrow based and stable.   Data: n/a   Thank you for allowing me to participate in patient's care.  If  I can answer any additional questions, I would be pleased to do so.    Sincerely,    Mazella Deen K. Tobie, DO

## 2024-02-12 NOTE — Patient Instructions (Signed)
 OK to take maxalt  10mg  for severe migraine   Return to clinic 1 year

## 2025-02-11 ENCOUNTER — Ambulatory Visit: Admitting: Neurology
